# Patient Record
Sex: Female | Born: 1960 | Race: Black or African American | Hispanic: No | Marital: Single | Smoking: Never smoker
Health system: Southern US, Community
[De-identification: ages and names within clinical notes are randomized; demographics above are authoritative.]

## PROBLEM LIST (undated history)

## (undated) DIAGNOSIS — M1990 Unspecified osteoarthritis, unspecified site: Secondary | ICD-10-CM

## (undated) DIAGNOSIS — I10 Essential (primary) hypertension: Secondary | ICD-10-CM

## (undated) HISTORY — PX: APPENDECTOMY: SHX54

## (undated) HISTORY — PX: TUBAL LIGATION: SHX77

## (undated) HISTORY — DX: Essential (primary) hypertension: I10

## (undated) HISTORY — PX: FINGER FRACTURE SURGERY: SHX638

## (undated) HISTORY — PX: OTHER SURGICAL HISTORY: SHX169

## (undated) HISTORY — PX: BREAST SURGERY: SHX581

## (undated) HISTORY — PX: VEIN SURGERY: SHX48

## (undated) HISTORY — PX: INCONTINENCE SURGERY: SHX676

## (undated) HISTORY — PX: MENISCUS REPAIR: SHX5179

## (undated) HISTORY — DX: Unspecified osteoarthritis, unspecified site: M19.90

---

## 2021-04-30 ENCOUNTER — Ambulatory Visit (INDEPENDENT_AMBULATORY_CARE_PROVIDER_SITE_OTHER): Payer: 59 | Admitting: Family Medicine

## 2021-04-30 ENCOUNTER — Encounter: Payer: Self-pay | Admitting: Family Medicine

## 2021-04-30 ENCOUNTER — Other Ambulatory Visit: Payer: Self-pay

## 2021-04-30 VITALS — BP 136/78 | HR 71 | Temp 97.2°F | Ht 62.0 in | Wt 174.0 lb

## 2021-04-30 DIAGNOSIS — D649 Anemia, unspecified: Secondary | ICD-10-CM | POA: Diagnosis not present

## 2021-04-30 DIAGNOSIS — E876 Hypokalemia: Secondary | ICD-10-CM | POA: Insufficient documentation

## 2021-04-30 DIAGNOSIS — F4320 Adjustment disorder, unspecified: Secondary | ICD-10-CM | POA: Insufficient documentation

## 2021-04-30 DIAGNOSIS — E559 Vitamin D deficiency, unspecified: Secondary | ICD-10-CM

## 2021-04-30 DIAGNOSIS — E7800 Pure hypercholesterolemia, unspecified: Secondary | ICD-10-CM | POA: Diagnosis not present

## 2021-04-30 DIAGNOSIS — I10 Essential (primary) hypertension: Secondary | ICD-10-CM | POA: Diagnosis not present

## 2021-04-30 DIAGNOSIS — Z0000 Encounter for general adult medical examination without abnormal findings: Secondary | ICD-10-CM

## 2021-04-30 DIAGNOSIS — E538 Deficiency of other specified B group vitamins: Secondary | ICD-10-CM | POA: Insufficient documentation

## 2021-04-30 LAB — URINALYSIS, ROUTINE W REFLEX MICROSCOPIC
Bilirubin Urine: NEGATIVE
Hgb urine dipstick: NEGATIVE
Ketones, ur: NEGATIVE
Leukocytes,Ua: NEGATIVE
Nitrite: NEGATIVE
RBC / HPF: NONE SEEN (ref 0–?)
Specific Gravity, Urine: <=1.005 — AB (ref 1.000–1.030)
Total Protein, Urine: NEGATIVE
Urine Glucose: NEGATIVE
Urobilinogen, UA: 0.2 (ref 0.0–1.0)
WBC, UA: NONE SEEN (ref 0–?)
pH: 7.5 (ref 5.0–8.0)

## 2021-04-30 LAB — COMPREHENSIVE METABOLIC PANEL
ALT: 18 U/L (ref 0–35)
AST: 20 U/L (ref 0–37)
Albumin: 4.7 g/dL (ref 3.5–5.2)
Alkaline Phosphatase: 63 U/L (ref 39–117)
BUN: 9 mg/dL (ref 6–23)
CO2: 33 mEq/L — ABNORMAL HIGH (ref 19–32)
Calcium: 10 mg/dL (ref 8.4–10.5)
Chloride: 100 mEq/L (ref 96–112)
Creatinine, Ser: 0.77 mg/dL (ref 0.40–1.20)
GFR: 84.33 mL/min (ref >60.00)
Glucose, Bld: 103 mg/dL — ABNORMAL HIGH (ref 70–99)
Potassium: 3 mEq/L — ABNORMAL LOW (ref 3.5–5.1)
Sodium: 142 mEq/L (ref 135–145)
Total Bilirubin: 0.7 mg/dL (ref 0.2–1.2)
Total Protein: 7.5 g/dL (ref 6.0–8.3)

## 2021-04-30 LAB — LIPID PANEL
Cholesterol: 190 mg/dL (ref 0–200)
HDL: 61.3 mg/dL (ref >39.00)
LDL Cholesterol: 109 mg/dL — ABNORMAL HIGH (ref 0–99)
NonHDL: 128.75
Total CHOL/HDL Ratio: 3
Triglycerides: 101 mg/dL (ref 0.0–149.0)
VLDL: 20.2 mg/dL (ref 0.0–40.0)

## 2021-04-30 LAB — CBC
HCT: 42.3 % (ref 36.0–46.0)
Hemoglobin: 14.4 g/dL (ref 12.0–15.0)
MCHC: 34.1 g/dL (ref 30.0–36.0)
MCV: 80.5 fl (ref 78.0–100.0)
Platelets: 244 10*3/uL (ref 150.0–400.0)
RBC: 5.25 Mil/uL — ABNORMAL HIGH (ref 3.87–5.11)
RDW: 13.7 % (ref 11.5–15.5)
WBC: 6.6 10*3/uL (ref 4.0–10.5)

## 2021-04-30 LAB — B12 AND FOLATE PANEL
Folate: 19.9 ng/mL (ref >5.9)
Vitamin B-12: 184 pg/mL — ABNORMAL LOW (ref 211–911)

## 2021-04-30 LAB — VITAMIN D 25 HYDROXY (VIT D DEFICIENCY, FRACTURES): VITD: 57.42 ng/mL (ref 30.00–100.00)

## 2021-04-30 LAB — TSH: TSH: 2.03 u[IU]/mL (ref 0.35–4.50)

## 2021-04-30 MED ORDER — POTASSIUM CHLORIDE CRYS ER 20 MEQ PO TBCR: 20.0000 meq | EXTENDED_RELEASE_TABLET | Freq: Every day | ORAL | 3 refills | Status: DC

## 2021-04-30 MED ORDER — VITAMIN B-12 1000 MCG PO TABS: ORAL_TABLET | ORAL | 2 refills | Status: DC

## 2021-04-30 NOTE — Progress Notes (Signed)
Lab work revealed 2 significant issues.  1. Potassium is low as reported. Have sent a rx for potassium to pharmacy. It is important for her to take this as directed.  2. Vit B12 is low. Have sent rx to pharmacy. This could be the cause of the numbness and tingling that she has been feeling about her body.   Follow up in 3 months instead of 6.

## 2021-04-30 NOTE — Addendum Note (Signed)
Addended by: Andrez Grime on: 04/30/2021 05:10 PM   Modules accepted: Orders

## 2021-04-30 NOTE — Progress Notes (Addendum)
New Patient Office Visit  Subjective:  Patient ID: Amanda Raymond, female    DOB: January 16, 1961  Age: 60 y.o. MRN: [id number]  CC:  Chief Complaint  Patient presents with   Establish Care    NP/establish care states that she has sharp pains in right leg that come and go. Refill on medications. Patient fasting.     HPI [name] presents for establishment of care.  Recently moved into this area back in April to be closer to her daughter who lives in her last year at a local college.  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  She has another daughter up in the [place] area who is also in college.  She still grieves the loss of her murdered son some 12 years ago.  He was found strangled in his apartment.  History of hypertension and elevated cholesterol treated with amlodipine, chlorthalidone and atorvastatin respectively.  Having no issues taking these medications.  She admits some problems keeping her potassium in the normal range.  She has pains along the veins of the right leg.  She experiences paresthesias in in her upper and lower extremities.  For now these are intermittent and will pass spontaneously.  She complains of dryness in OD.  She does not smoke, drink alcohol or use illicit drugs.  Past Medical History:  Diagnosis Date   Arthritis    Hypertension     Past Surgical History:  Procedure Laterality Date   appendicitis     FINGER FRACTURE SURGERY Left    MENISCUS REPAIR Left    TUBAL LIGATION Bilateral    VEIN SURGERY      Family History  Problem Relation Age of Onset   Hypertension Mother     Social History   Socioeconomic History   Marital status: Single    Spouse name: Not on file   Number of children: Not on file   Years of education: Not on file   Highest education level: Not on file  Occupational History   Not on file  Tobacco Use   Smoking status: Never   Smokeless tobacco: Never  Vaping Use   Vaping Use: Never used  Substance  and Sexual Activity   Alcohol use: Never   Drug use: Never   Sexual activity: Not Currently  Other Topics Concern   Not on file  Social History Narrative   Not on file   Social Determinants of Health   Financial Resource Strain: Not on file  Food Insecurity: Not on file  Transportation Needs: Not on file  Physical Activity: Not on file  Stress: Not on file  Social Connections: Not on file  Intimate Partner Violence: Not on file    ROS Review of Systems  Constitutional: Negative.   HENT: Negative.    Eyes:  Negative for photophobia and visual disturbance.  Respiratory: Negative.    Cardiovascular: Negative.   Gastrointestinal: Negative.   Endocrine: Negative for polyphagia and polyuria.  Genitourinary: Negative.   Musculoskeletal:  Negative for gait problem and joint swelling.  Skin: Negative.   Neurological:  Positive for numbness. Negative for weakness.  Hematological:  Does not bruise/bleed easily.  Psychiatric/Behavioral:  Positive for dysphoric mood.    Objective:   Today's Vitals: BP 136/78   Pulse 71   Temp (!) 97.2 F (36.2 C) (Temporal)   Ht 5\' 2"  (1.575 m)   Wt 174 lb (78.9 kg)   SpO2 99%   BMI 31.83 kg/m   Physical  Exam Vitals and nursing note reviewed.  Constitutional:      General: She is not in acute distress.    Appearance: Normal appearance. She is not ill-appearing, toxic-appearing or diaphoretic.  HENT:     Head: Normocephalic and atraumatic.     Right Ear: Tympanic membrane, ear canal and external ear normal.     Left Ear: Tympanic membrane, ear canal and external ear normal.     Mouth/Throat:     Mouth: Mucous membranes are moist.     Pharynx: Oropharynx is clear. No oropharyngeal exudate or posterior oropharyngeal erythema.  Eyes:     General: No scleral icterus.       Right eye: No discharge.        Left eye: No discharge.     Extraocular Movements: Extraocular movements intact.     Conjunctiva/sclera: Conjunctivae normal.      Pupils: Pupils are equal, round, and reactive to light.  Neck:     Vascular: No carotid bruit.  Cardiovascular:     Rate and Rhythm: Normal rate and regular rhythm.  Pulmonary:     Effort: Pulmonary effort is normal.     Breath sounds: Normal breath sounds.  Abdominal:     General: Bowel sounds are normal.  Musculoskeletal:     Cervical back: No rigidity or tenderness.     Right lower leg: No edema.     Left lower leg: No edema.  Lymphadenopathy:     Cervical: No cervical adenopathy.  Skin:    General: Skin is warm and dry.  Neurological:     Mental Status: She is alert and oriented to person, place, and time.  Psychiatric:        Mood and Affect: Mood normal.        Behavior: Behavior normal.    Assessment & Plan:   Problem List Items Addressed This Visit       Cardiovascular and Mediastinum   Essential hypertension - Primary   Relevant Medications   amLODipine (NORVASC) 5 MG tablet   atorvastatin (LIPITOR) 10 MG tablet   chlorthalidone (HYGROTON) 25 MG tablet   Other Relevant Orders   CBC (Completed)   Comprehensive metabolic panel (Completed)   Urinalysis, Routine w reflex microscopic (Completed)     Other   Elevated cholesterol   Relevant Medications   amLODipine (NORVASC) 5 MG tablet   atorvastatin (LIPITOR) 10 MG tablet   chlorthalidone (HYGROTON) 25 MG tablet   Other Relevant Orders   Comprehensive metabolic panel (Completed)   Lipid panel (Completed)   Adjustment disorder   Vitamin D deficiency   Relevant Orders   VITAMIN D 25 Hydroxy (Vit-D Deficiency, Fractures) (Completed)   Healthcare maintenance   Relevant Orders   MM Digital Screening   Ambulatory referral to Gynecology   Ambulatory referral to Ophthalmology   Anemia   Relevant Medications   vitamin B-12 (CYANOCOBALAMIN) 1000 MCG tablet   Other Relevant Orders   B12 and Folate Panel (Completed)   TSH (Completed)   Iron, TIBC and Ferritin Panel   Hypokalemia   Relevant Medications    potassium chloride SA (KLOR-CON) 20 MEQ tablet   Vitamin B 12 deficiency   Relevant Medications   vitamin B-12 (CYANOCOBALAMIN) 1000 MCG tablet    Outpatient Encounter Medications as of 04/30/2021  Medication Sig   amLODipine (NORVASC) 5 MG tablet Take 1 tablet by mouth daily.   atorvastatin (LIPITOR) 10 MG tablet Take 1 tablet by mouth daily.   chlorthalidone (HYGROTON) 25  MG tablet Take 25 mg by mouth daily.   potassium chloride SA (KLOR-CON) 20 MEQ tablet Take 1 tablet (20 mEq total) by mouth daily.   vitamin B-12 (CYANOCOBALAMIN) 1000 MCG tablet Take one daily.   No facility-administered encounter medications on file as of 04/30/2021.    Follow-up: Return in about 3 months (around 07/31/2021), or if symptoms worsen or fail to improve.   Patient was given information on managing loss.  We discussed her PHQ-9 results and she would like to hold treatment for you.  He was given information on preventing high cholesterol and managing hypertension.  Also given information on preventive care and health maintenance.  Invited her to increase her exercise by joining the copious walking trails in the area along with her poodle.  She will keep me apprised of the paresthesias she has been experiencing in her extremities.  Discussed that she may need supplemental potassium.  Mliss Sax, MD

## 2021-05-01 LAB — IRON,TIBC AND FERRITIN PANEL
%SAT: 34 % (calc) (ref 16–45)
Ferritin: 102 ng/mL (ref 16–232)
Iron: 105 ug/dL (ref 45–160)
TIBC: 313 mcg/dL (calc) (ref 250–450)

## 2021-06-01 ENCOUNTER — Other Ambulatory Visit: Payer: Self-pay | Admitting: Family Medicine

## 2021-06-01 NOTE — Telephone Encounter (Signed)
Refill request for Atorvastatin and Chlorthliadone.  Both filled by HX provider.  LOV 04/30/21  Please review and advise.  Thanks.  Dm/cma

## 2021-06-06 ENCOUNTER — Other Ambulatory Visit: Payer: Self-pay | Admitting: Family Medicine

## 2021-06-11 ENCOUNTER — Ambulatory Visit (HOSPITAL_BASED_OUTPATIENT_CLINIC_OR_DEPARTMENT_OTHER)
Admission: RE | Admit: 2021-06-11 | Discharge: 2021-06-11 | Disposition: A | Discharge status: Home / Self Care | Payer: 59 | Source: Ambulatory Visit | Attending: Family Medicine | Admitting: Family Medicine

## 2021-06-11 ENCOUNTER — Encounter (HOSPITAL_BASED_OUTPATIENT_CLINIC_OR_DEPARTMENT_OTHER): Payer: Self-pay

## 2021-06-11 ENCOUNTER — Other Ambulatory Visit: Payer: Self-pay

## 2021-06-11 DIAGNOSIS — Z1231 Encounter for screening mammogram for malignant neoplasm of breast: Secondary | ICD-10-CM | POA: Insufficient documentation

## 2021-06-11 DIAGNOSIS — Z0000 Encounter for general adult medical examination without abnormal findings: Secondary | ICD-10-CM | POA: Insufficient documentation

## 2021-07-13 ENCOUNTER — Other Ambulatory Visit: Payer: Self-pay | Admitting: Family Medicine

## 2021-07-17 ENCOUNTER — Other Ambulatory Visit: Payer: Self-pay

## 2021-07-17 ENCOUNTER — Ambulatory Visit (INDEPENDENT_AMBULATORY_CARE_PROVIDER_SITE_OTHER): Payer: 59 | Admitting: Nurse Practitioner

## 2021-07-17 ENCOUNTER — Encounter: Payer: Self-pay | Admitting: Nurse Practitioner

## 2021-07-17 ENCOUNTER — Other Ambulatory Visit (HOSPITAL_COMMUNITY)
Admission: RE | Admit: 2021-07-17 | Discharge: 2021-07-17 | Disposition: A | Discharge status: Home / Self Care | Payer: 59 | Source: Ambulatory Visit | Attending: Nurse Practitioner | Admitting: Nurse Practitioner

## 2021-07-17 VITALS — BP 130/84 | Ht 61.5 in | Wt 176.0 lb

## 2021-07-17 DIAGNOSIS — Z01419 Encounter for gynecological examination (general) (routine) without abnormal findings: Secondary | ICD-10-CM | POA: Diagnosis not present

## 2021-07-17 DIAGNOSIS — Z780 Asymptomatic menopausal state: Secondary | ICD-10-CM | POA: Diagnosis not present

## 2021-07-17 NOTE — Progress Notes (Signed)
   Amanda Raymond [birth year] [id number]   History:  60 y.o. G3P3 presents to establish care. No GYN complaints. Postmenopausal - no HRT, no bleeding. Normal pap and mammogram history. Moved here in April from [place]. HTN, HLD managed by PCP.  Gynecologic History No LMP recorded. Patient has had an ablation.   Contraception/Family planning: post menopausal status Sexually active: No  Health Maintenance Last Pap: 2019. Results were: Normal Last mammogram: 06/11/2021. Results were: Normal Last colonoscopy: 2018. Results were: Normal, 10-year recall Last Dexa: 04/17/2020. Results were: Normal  Past medical history, past surgical history, family history and social history were all reviewed and documented in the EPIC chart. Single. 2 daughters, one at [employer], one at school in [place]. Toy poodle named Mocha.   ROS:  A ROS was performed and pertinent positives and negatives are included.  Exam:  Vitals:   07/17/21 1448  BP: 130/84  Weight: 176 lb (79.8 kg)  Height: 5' 1.5" (1.562 m)   Body mass index is 32.72 kg/m.  General appearance:  Normal Thyroid:  Symmetrical, normal in size, without palpable masses or nodularity. Respiratory  Auscultation:  Clear without wheezing or rhonchi Cardiovascular  Auscultation:  Regular rate, without rubs, murmurs or gallops  Edema/varicosities:  Not grossly evident Abdominal  Soft,nontender, without masses, guarding or rebound.  Liver/spleen:  No organomegaly noted  Hernia:  None appreciated  Skin  Inspection:  Grossly normal Breasts: Examined lying and sitting.   Right: Without masses, retractions, nipple discharge or axillary adenopathy.   Left: Without masses, retractions, nipple discharge or axillary adenopathy. Genitourinary   Inguinal/mons:  Normal without inguinal adenopathy  External genitalia:  Normal appearing vulva with no masses, tenderness, or lesions  BUS/Urethra/Skene's glands:  Normal  Vagina:  Atrophic changes  Cervix:   Normal appearing without discharge or lesions  Uterus:  Normal in size, shape and contour.  Midline and mobile, nontender  Adnexa/parametria:     Rt: Normal in size, without masses or tenderness.   Lt: Normal in size, without masses or tenderness.  Anus and perineum: Normal  Digital rectal exam: Normal sphincter tone without palpated masses or tenderness  Patient informed chaperone available to be present for breast and pelvic exam. Patient has requested no chaperone to be present. Patient has been advised what will be completed during breast and pelvic exam.   Assessment/Plan:  60 y.o. G3P3 for annual exam.   Well female exam with routine gynecological exam - Plan: Cytology - PAP( ) - Education provided on SBEs, importance of preventative screenings, current guidelines, high calcium diet, regular exercise, and multivitamin daily. Recently established with PCP, labs done at that time.   Postmenopausal - no HRT, no bleeding.   Screening for cervical cancer - Normal Pap history. She believes most recent pap was in 2019. Pap with HR HPV today. She has requested records be sent to our office.   Screening for breast cancer - Normal mammogram history.  Continue annual screenings.  Normal breast exam today.  Screening for colon cancer - 2018 colonoscopy (per patient). Will repeat at GI's recommended interval.   Screening for osteoporosis - Normal Dexa 06/2021. Will repeat at 5-year interval per recommendation.   Return in 1 year for annual.   Olivia Mackie DNP, 3:24 PM 07/17/2021

## 2021-07-21 ENCOUNTER — Other Ambulatory Visit: Payer: Self-pay | Admitting: Family Medicine

## 2021-07-21 DIAGNOSIS — E876 Hypokalemia: Secondary | ICD-10-CM

## 2021-07-23 LAB — CYTOLOGY - PAP
Comment: NEGATIVE
Diagnosis: NEGATIVE
High risk HPV: NEGATIVE

## 2021-07-31 ENCOUNTER — Encounter: Payer: Self-pay | Admitting: Family Medicine

## 2021-07-31 ENCOUNTER — Other Ambulatory Visit: Payer: Self-pay

## 2021-07-31 ENCOUNTER — Ambulatory Visit (INDEPENDENT_AMBULATORY_CARE_PROVIDER_SITE_OTHER): Payer: 59 | Admitting: Family Medicine

## 2021-07-31 VITALS — BP 130/78 | HR 88 | Temp 97.4°F | Ht 61.0 in | Wt 176.0 lb

## 2021-07-31 DIAGNOSIS — E7800 Pure hypercholesterolemia, unspecified: Secondary | ICD-10-CM | POA: Diagnosis not present

## 2021-07-31 DIAGNOSIS — Z0000 Encounter for general adult medical examination without abnormal findings: Secondary | ICD-10-CM

## 2021-07-31 DIAGNOSIS — E538 Deficiency of other specified B group vitamins: Secondary | ICD-10-CM | POA: Diagnosis not present

## 2021-07-31 DIAGNOSIS — I10 Essential (primary) hypertension: Secondary | ICD-10-CM | POA: Diagnosis not present

## 2021-07-31 DIAGNOSIS — E876 Hypokalemia: Secondary | ICD-10-CM

## 2021-07-31 LAB — BASIC METABOLIC PANEL
BUN: 13 mg/dL (ref 6–23)
CO2: 31 mEq/L (ref 19–32)
Calcium: 9.7 mg/dL (ref 8.4–10.5)
Chloride: 103 mEq/L (ref 96–112)
Creatinine, Ser: 0.81 mg/dL (ref 0.40–1.20)
GFR: 79.22 mL/min (ref >60.00)
Glucose, Bld: 96 mg/dL (ref 70–99)
Potassium: 3.4 mEq/L — ABNORMAL LOW (ref 3.5–5.1)
Sodium: 142 mEq/L (ref 135–145)

## 2021-07-31 LAB — VITAMIN B12: Vitamin B-12: 552 pg/mL (ref 211–911)

## 2021-07-31 NOTE — Progress Notes (Addendum)
Established Patient Office Visit  Subjective:  Patient ID: Amanda Raymond, female    DOB: October 25, 1961  Age: 60 y.o. MRN: [id number]  CC:  Chief Complaint  Patient presents with   Follow-up    Follow up on BP and labs. Patient would like to come down on BP meds and cholesterol meds. Fasting for labs.     HPI Amanda Raymond presents for follow-up of B12 deficiency and hypokalemia.  She has been taking her vitamin B-12 daily without issue.  Continues the potassium.  Wonders if she can discontinue the statin.  Explained that her cholesterol was high and back upwards if she did not discontinue.  She has never had a flu shot and does not really want to have one.  She is has had 2 COVID vaccines.  Past Medical History:  Diagnosis Date   Arthritis    Hypertension     Past Surgical History:  Procedure Laterality Date   APPENDECTOMY     appendicitis     BREAST SURGERY Right    Lumpectomy-benign   FINGER FRACTURE SURGERY Left    INCONTINENCE SURGERY N/A    MENISCUS REPAIR Left    TUBAL LIGATION Bilateral    VEIN SURGERY      Family History  Problem Relation Age of Onset   Hypertension Mother    Hypertension Brother    Breast cancer Maternal Grandmother     Social History   Socioeconomic History   Marital status: Single    Spouse name: Not on file   Number of children: Not on file   Years of education: Not on file   Highest education level: Not on file  Occupational History   Not on file  Tobacco Use   Smoking status: Never   Smokeless tobacco: Never  Vaping Use   Vaping Use: Never used  Substance and Sexual Activity   Alcohol use: Never   Drug use: Never   Sexual activity: Yes  Other Topics Concern   Not on file  Social History Narrative   Not on file   Social Determinants of Health   Financial Resource Strain: Not on file  Food Insecurity: Not on file  Transportation Needs: Not on file  Physical Activity: Not on file  Stress: Not on file  Social  Connections: Not on file  Intimate Partner Violence: Not on file    Outpatient Medications Prior to Visit  Medication Sig Dispense Refill   amLODipine (NORVASC) 5 MG tablet TAKE 1 TABLET BY MOUTH EVERY DAY. DISCONTINUE IRBESARTAN 300MG  30 tablet 1   atorvastatin (LIPITOR) 10 MG tablet TAKE 1 TABLET BY MOUTH EVERY DAY 90 tablet 0   chlorthalidone (HYGROTON) 25 MG tablet TAKE 1 TABLET (25 MG TOTAL) BY MOUTH DAILY. 90 tablet 0   KLOR-CON M20 20 MEQ tablet TAKE 1 TABLET BY MOUTH EVERY DAY 90 tablet 1   vitamin B-12 (CYANOCOBALAMIN) 1000 MCG tablet Take one daily. 90 tablet 2   No facility-administered medications prior to visit.    Allergies  Allergen Reactions   Bactrim [Sulfamethoxazole-Trimethoprim] Nausea And Vomiting   Toradol [Ketorolac Tromethamine] Swelling    ROS Review of Systems  Constitutional: Negative.   Respiratory: Negative.    Cardiovascular: Negative.   Gastrointestinal: Negative.   Genitourinary: Negative.   Psychiatric/Behavioral: Negative.       Objective:    Physical Exam Vitals and nursing note reviewed.  Constitutional:      General: She is not in acute distress.    Appearance: Normal  appearance. She is not ill-appearing, toxic-appearing or diaphoretic.  HENT:     Head: Normocephalic and atraumatic.     Right Ear: External ear normal.     Left Ear: External ear normal.  Eyes:     Extraocular Movements: Extraocular movements intact.     Conjunctiva/sclera: Conjunctivae normal.  Cardiovascular:     Rate and Rhythm: Normal rate and regular rhythm.  Pulmonary:     Effort: Pulmonary effort is normal.     Breath sounds: Normal breath sounds.  Abdominal:     General: Bowel sounds are normal.  Musculoskeletal:     Cervical back: No rigidity or tenderness.     Right lower leg: No edema.     Left lower leg: No edema.  Lymphadenopathy:     Cervical: No cervical adenopathy.  Skin:    General: Skin is warm and dry.  Neurological:     Mental Status:  She is alert and oriented to person, place, and time.  Psychiatric:        Mood and Affect: Mood normal.        Behavior: Behavior normal.    BP 130/78 (BP Location: Right Arm, Patient Position: Sitting, Cuff Size: Normal)   Pulse 88   Temp (!) 97.4 F (36.3 C) (Temporal)   Ht 5\' 1"  (1.549 m)   Wt 176 lb (79.8 kg)   SpO2 98%   BMI 33.25 kg/m  Wt Readings from Last 3 Encounters:  07/31/21 176 lb (79.8 kg)  07/17/21 176 lb (79.8 kg)  04/30/21 174 lb (78.9 kg)     Health Maintenance Due  Topic Date Due   HIV Screening  Never done   Hepatitis C Screening  Never done   Zoster Vaccines- Shingrix (1 of 2) Never done   INFLUENZA VACCINE  Never done    There are no preventive care reminders to display for this patient.  Lab Results  Component Value Date   TSH 2.03 04/30/2021   Lab Results  Component Value Date   WBC 6.6 04/30/2021   HGB 14.4 04/30/2021   HCT 42.3 04/30/2021   MCV 80.5 04/30/2021   PLT 244.0 04/30/2021   Lab Results  Component Value Date   NA 142 07/31/2021   K 3.4 (L) 07/31/2021   CO2 31 07/31/2021   GLUCOSE 96 07/31/2021   BUN 13 07/31/2021   CREATININE 0.81 07/31/2021   BILITOT 0.7 04/30/2021   ALKPHOS 63 04/30/2021   AST 20 04/30/2021   ALT 18 04/30/2021   PROT 7.5 04/30/2021   ALBUMIN 4.7 04/30/2021   CALCIUM 9.7 07/31/2021   GFR 79.22 07/31/2021   Lab Results  Component Value Date   CHOL 190 04/30/2021   Lab Results  Component Value Date   HDL 61.30 04/30/2021   Lab Results  Component Value Date   LDLCALC 109 (H) 04/30/2021   Lab Results  Component Value Date   TRIG 101.0 04/30/2021   Lab Results  Component Value Date   CHOLHDL 3 04/30/2021   No results found for: HGBA1C   The 10-year ASCVD risk score (Arnett DK, et al., 2019) is: 6%   Values used to calculate the score:     Age: 19 years     Sex: Female     Is Non-Hispanic African American: Yes     Diabetic: No     Tobacco smoker: No     Systolic Blood Pressure:  130 mmHg     Is BP treated: Yes  HDL Cholesterol: 61.3 mg/dL     Total Cholesterol: 190 mg/dL  Assessment & Plan:   Problem List Items Addressed This Visit       Cardiovascular and Mediastinum   Essential hypertension - Primary   Relevant Orders   Basic metabolic panel (Completed)     Other   Elevated cholesterol   Healthcare maintenance   Hypokalemia   Relevant Orders   Basic metabolic panel (Completed)   Vitamin B 12 deficiency   Relevant Orders   Vitamin B12 (Completed)    No orders of the defined types were placed in this encounter.   Follow-up: Return in about 6 months (around 01/28/2022), or Continue all medications including potassium.  Please take all daily..  Encourage patient to have influenza vaccine and the COVID booster.  Information was given on immunizations and cranial disease with immunizations.  We discussed discontinuing the chlorthalidone and potassium in favor of raising amlodipine.  Patient would like to continue the chlorthalidone because it helps with the swelling in her lower extremities even if it means having to take the potassium pill daily.  She will continue her high-dose B12 and statin medication.  Mliss Sax, MD

## 2021-08-05 ENCOUNTER — Other Ambulatory Visit: Payer: Self-pay | Admitting: Family Medicine

## 2021-09-04 ENCOUNTER — Other Ambulatory Visit: Payer: Self-pay | Admitting: Family Medicine

## 2021-10-30 ENCOUNTER — Ambulatory Visit: Payer: 59 | Admitting: Family Medicine

## 2021-12-02 ENCOUNTER — Other Ambulatory Visit: Payer: Self-pay | Admitting: Family Medicine

## 2022-01-14 ENCOUNTER — Other Ambulatory Visit: Payer: Self-pay | Admitting: Family Medicine

## 2022-01-14 DIAGNOSIS — E876 Hypokalemia: Secondary | ICD-10-CM

## 2022-01-23 ENCOUNTER — Other Ambulatory Visit: Payer: Self-pay | Admitting: Family Medicine

## 2022-01-23 DIAGNOSIS — E538 Deficiency of other specified B group vitamins: Secondary | ICD-10-CM

## 2022-01-29 ENCOUNTER — Encounter: Payer: Self-pay | Admitting: Family Medicine

## 2022-01-29 ENCOUNTER — Ambulatory Visit (INDEPENDENT_AMBULATORY_CARE_PROVIDER_SITE_OTHER): Payer: 59 | Admitting: Family Medicine

## 2022-01-29 ENCOUNTER — Other Ambulatory Visit: Payer: Self-pay

## 2022-01-29 VITALS — BP 150/90 | HR 82 | Temp 97.1°F | Ht 61.0 in | Wt 162.4 lb

## 2022-01-29 DIAGNOSIS — E876 Hypokalemia: Secondary | ICD-10-CM

## 2022-01-29 DIAGNOSIS — E538 Deficiency of other specified B group vitamins: Secondary | ICD-10-CM | POA: Diagnosis not present

## 2022-01-29 DIAGNOSIS — Z23 Encounter for immunization: Secondary | ICD-10-CM | POA: Diagnosis not present

## 2022-01-29 DIAGNOSIS — E7800 Pure hypercholesterolemia, unspecified: Secondary | ICD-10-CM

## 2022-01-29 DIAGNOSIS — I10 Essential (primary) hypertension: Secondary | ICD-10-CM | POA: Diagnosis not present

## 2022-01-29 LAB — COMPREHENSIVE METABOLIC PANEL
ALT: 15 U/L (ref 0–35)
AST: 18 U/L (ref 0–37)
Albumin: 4.5 g/dL (ref 3.5–5.2)
Alkaline Phosphatase: 64 U/L (ref 39–117)
BUN: 11 mg/dL (ref 6–23)
CO2: 31 mEq/L (ref 19–32)
Calcium: 9.9 mg/dL (ref 8.4–10.5)
Chloride: 101 mEq/L (ref 96–112)
Creatinine, Ser: 0.83 mg/dL (ref 0.40–1.20)
GFR: 76.67 mL/min (ref >60.00)
Glucose, Bld: 108 mg/dL — ABNORMAL HIGH (ref 70–99)
Potassium: 3 mEq/L — ABNORMAL LOW (ref 3.5–5.1)
Sodium: 143 mEq/L (ref 135–145)
Total Bilirubin: 0.6 mg/dL (ref 0.2–1.2)
Total Protein: 7.1 g/dL (ref 6.0–8.3)

## 2022-01-29 LAB — LIPID PANEL
Cholesterol: 160 mg/dL (ref 0–200)
HDL: 54.2 mg/dL (ref >39.00)
LDL Cholesterol: 91 mg/dL (ref 0–99)
NonHDL: 105.91
Total CHOL/HDL Ratio: 3
Triglycerides: 74 mg/dL (ref 0.0–149.0)
VLDL: 14.8 mg/dL (ref 0.0–40.0)

## 2022-01-29 LAB — LDL CHOLESTEROL, DIRECT: Direct LDL: 86 mg/dL

## 2022-01-29 LAB — VITAMIN B12: Vitamin B-12: 1071 pg/mL — ABNORMAL HIGH (ref 211–911)

## 2022-01-29 MED ORDER — AMLODIPINE BESYLATE 10 MG PO TABS: 10.0000 mg | ORAL_TABLET | Freq: Every day | ORAL | 1 refills | Status: DC

## 2022-01-29 NOTE — Progress Notes (Signed)
? ?Established Patient Office Visit ? ?Subjective:  ?Patient ID: Amanda Raymond, female    DOB: 1961-06-28  Age: 61 y.o. MRN: [id number] ? ?CC:  ?Chief Complaint  ?Patient presents with  ? Follow-up  ?  Follow up Pt want to discuss Blood Presser Meds.  ? ? ?HPI ?Amanda Raymond presents for follow-up of hypertension and elevated cholesterol.  She is fasting today.  Potassium pill is sometimes difficult to swallow.  She is otherwise having no difficulty with her medications ? ?Past Medical History:  ?Diagnosis Date  ? Arthritis   ? Hypertension   ? ? ?Past Surgical History:  ?Procedure Laterality Date  ? APPENDECTOMY    ? appendicitis    ? BREAST SURGERY Right   ? Lumpectomy-benign  ? FINGER FRACTURE SURGERY Left   ? INCONTINENCE SURGERY N/A   ? MENISCUS REPAIR Left   ? TUBAL LIGATION Bilateral   ? VEIN SURGERY    ? ? ?Family History  ?Problem Relation Age of Onset  ? Hypertension Mother   ? Hypertension Brother   ? Breast cancer Maternal Grandmother   ? ? ?Social History  ? ?Socioeconomic History  ? Marital status: Single  ?  Spouse name: Not on file  ? Number of children: Not on file  ? Years of education: Not on file  ? Highest education level: Not on file  ?Occupational History  ? Not on file  ?Tobacco Use  ? Smoking status: Never  ? Smokeless tobacco: Never  ?Vaping Use  ? Vaping Use: Never used  ?Substance and Sexual Activity  ? Alcohol use: Never  ? Drug use: Never  ? Sexual activity: Yes  ?Other Topics Concern  ? Not on file  ?Social History Narrative  ? Not on file  ? ?Social Determinants of Health  ? ?Financial Resource Strain: Not on file  ?Food Insecurity: Not on file  ?Transportation Needs: Not on file  ?Physical Activity: Not on file  ?Stress: Not on file  ?Social Connections: Not on file  ?Intimate Partner Violence: Not on file  ? ? ?Outpatient Medications Prior to Visit  ?Medication Sig Dispense Refill  ? vitamin B-12 (CYANOCOBALAMIN) 1000 MCG tablet TAKE 1 TABLET BY MOUTH EVERY DAY 90 tablet 0  ?  chlorthalidone (HYGROTON) 25 MG tablet TAKE 1 TABLET (25 MG TOTAL) BY MOUTH DAILY. 90 tablet 0  ? KLOR-CON M20 20 MEQ tablet TAKE 1 TABLET BY MOUTH EVERY DAY 90 tablet 1  ? atorvastatin (LIPITOR) 10 MG tablet TAKE 1 TABLET BY MOUTH EVERY DAY (Patient not taking: Reported on 01/29/2022) 90 tablet 0  ? amLODipine (NORVASC) 5 MG tablet TAKE 1 TABLET BY MOUTH EVERY DAY. DISCONTINUE IRBESARTAN 300MG  90 tablet 1  ? ?No facility-administered medications prior to visit.  ? ? ?Allergies  ?Allergen Reactions  ? Bactrim [Sulfamethoxazole-Trimethoprim] Nausea And Vomiting  ? Toradol [Ketorolac Tromethamine] Swelling  ? ? ?ROS ?Review of Systems  ?Constitutional:  Negative for chills, diaphoresis, fatigue, fever and unexpected weight change.  ?HENT: Negative.    ?Eyes:  Negative for photophobia and visual disturbance.  ?Respiratory: Negative.    ?Cardiovascular: Negative.   ?Gastrointestinal: Negative.   ?Endocrine: Negative for polyphagia and polyuria.  ?Genitourinary: Negative.   ?Musculoskeletal:  Negative for gait problem and joint swelling.  ?Neurological:  Negative for speech difficulty, weakness and numbness.  ?Psychiatric/Behavioral: Negative.    ? ?  ?Objective:  ?  ?Physical Exam ?Vitals and nursing note reviewed.  ?Constitutional:   ?   General: She is not  in acute distress. ?   Appearance: Normal appearance. She is not ill-appearing, toxic-appearing or diaphoretic.  ?HENT:  ?   Head: Normocephalic and atraumatic.  ?   Right Ear: External ear normal.  ?   Left Ear: External ear normal.  ?   Mouth/Throat:  ?   Mouth: Mucous membranes are moist.  ?   Pharynx: Oropharynx is clear. No oropharyngeal exudate or posterior oropharyngeal erythema.  ?Eyes:  ?   General: No scleral icterus.    ?   Right eye: No discharge.     ?   Left eye: No discharge.  ?   Extraocular Movements: Extraocular movements intact.  ?   Conjunctiva/sclera: Conjunctivae normal.  ?   Pupils: Pupils are equal, round, and reactive to light.   ?Cardiovascular:  ?   Rate and Rhythm: Normal rate and regular rhythm.  ?Pulmonary:  ?   Effort: Pulmonary effort is normal.  ?   Breath sounds: Normal breath sounds.  ?Abdominal:  ?   General: Bowel sounds are normal.  ?Musculoskeletal:  ?   Cervical back: No rigidity or tenderness.  ?Lymphadenopathy:  ?   Cervical: No cervical adenopathy.  ?Skin: ?   General: Skin is warm and dry.  ?Neurological:  ?   Mental Status: She is alert and oriented to person, place, and time.  ?Psychiatric:     ?   Mood and Affect: Mood normal.     ?   Behavior: Behavior normal.  ? ? ?BP (!) 150/90 (BP Location: Left Arm, Patient Position: Sitting, Cuff Size: Normal)   Pulse 82   Temp (!) 97.1 ?F (36.2 ?C) (Temporal)   Ht 5\' 1"  (1.549 m)   Wt 162 lb 6.4 oz (73.7 kg)   SpO2 98%   BMI 30.69 kg/m?  ?Wt Readings from Last 3 Encounters:  ?01/29/22 162 lb 6.4 oz (73.7 kg)  ?07/31/21 176 lb (79.8 kg)  ?07/17/21 176 lb (79.8 kg)  ? ? ? ?Health Maintenance Due  ?Topic Date Due  ? HIV Screening  Never done  ? Hepatitis C Screening  Never done  ? INFLUENZA VACCINE  Never done  ? ? ?There are no preventive care reminders to display for this patient. ? ?Lab Results  ?Component Value Date  ? TSH 2.03 04/30/2021  ? ?Lab Results  ?Component Value Date  ? WBC 6.6 04/30/2021  ? HGB 14.4 04/30/2021  ? HCT 42.3 04/30/2021  ? MCV 80.5 04/30/2021  ? PLT 244.0 04/30/2021  ? ?Lab Results  ?Component Value Date  ? NA 142 07/31/2021  ? K 3.4 (L) 07/31/2021  ? CO2 31 07/31/2021  ? GLUCOSE 96 07/31/2021  ? BUN 13 07/31/2021  ? CREATININE 0.81 07/31/2021  ? BILITOT 0.7 04/30/2021  ? ALKPHOS 63 04/30/2021  ? AST 20 04/30/2021  ? ALT 18 04/30/2021  ? PROT 7.5 04/30/2021  ? ALBUMIN 4.7 04/30/2021  ? CALCIUM 9.7 07/31/2021  ? GFR 79.22 07/31/2021  ? ?Lab Results  ?Component Value Date  ? CHOL 190 04/30/2021  ? ?Lab Results  ?Component Value Date  ? HDL 61.30 04/30/2021  ? ?Lab Results  ?Component Value Date  ? LDLCALC 109 (H) 04/30/2021  ? ?Lab Results   ?Component Value Date  ? TRIG 101.0 04/30/2021  ? ?Lab Results  ?Component Value Date  ? CHOLHDL 3 04/30/2021  ? ?No results found for: HGBA1C ? ?  ?Assessment & Plan:  ? ?Problem List Items Addressed This Visit   ? ?  ?  Cardiovascular and Mediastinum  ? Essential hypertension - Primary  ? Relevant Medications  ? amLODipine (NORVASC) 10 MG tablet  ? Other Relevant Orders  ? Comprehensive metabolic panel  ?  ? Other  ? Elevated cholesterol  ? Relevant Medications  ? amLODipine (NORVASC) 10 MG tablet  ? Other Relevant Orders  ? Comprehensive metabolic panel  ? LDL cholesterol, direct  ? Lipid panel  ? Hypokalemia  ? Relevant Orders  ? Comprehensive metabolic panel  ? Vitamin B 12 deficiency  ? Relevant Orders  ? Vitamin B12  ? Need for shingles vaccine  ? Relevant Orders  ? Varicella-zoster vaccine IM (Shingrix) (Completed)  ? ? ?Meds ordered this encounter  ?Medications  ? amLODipine (NORVASC) 10 MG tablet  ?  Sig: Take 1 tablet (10 mg total) by mouth daily.  ?  Dispense:  90 tablet  ?  Refill:  1  ? ? ?Follow-up: Return in about 3 months (around 05/01/2022).  ?Have discontinued chlorthalidone and potassium chloride.  Have increased amlodipine to 10 mg daily.  Asked patient to look out for lower extremity edema.  B12.  Checking cord blood pressures at home.  If needed may add potassium sparing diuretic.  Information was given on preventing high cholesterol and managing hypertension.  Information given on the Shingrix vaccine.  Second dose next visit. ? ?Libby Maw, MD ?

## 2022-02-03 ENCOUNTER — Other Ambulatory Visit: Payer: Self-pay | Admitting: Family Medicine

## 2022-03-01 ENCOUNTER — Other Ambulatory Visit: Payer: Self-pay | Admitting: Family Medicine

## 2022-04-29 ENCOUNTER — Ambulatory Visit (INDEPENDENT_AMBULATORY_CARE_PROVIDER_SITE_OTHER): Payer: 59 | Admitting: Family Medicine

## 2022-04-29 ENCOUNTER — Encounter: Payer: Self-pay | Admitting: Family Medicine

## 2022-04-29 VITALS — BP 134/82 | HR 77 | Temp 97.2°F | Ht 61.0 in | Wt 160.0 lb

## 2022-04-29 DIAGNOSIS — Z23 Encounter for immunization: Secondary | ICD-10-CM | POA: Diagnosis not present

## 2022-04-29 DIAGNOSIS — E538 Deficiency of other specified B group vitamins: Secondary | ICD-10-CM

## 2022-04-29 DIAGNOSIS — E7800 Pure hypercholesterolemia, unspecified: Secondary | ICD-10-CM | POA: Diagnosis not present

## 2022-04-29 DIAGNOSIS — I10 Essential (primary) hypertension: Secondary | ICD-10-CM

## 2022-04-29 LAB — BASIC METABOLIC PANEL
BUN: 11 mg/dL (ref 6–23)
CO2: 26 mEq/L (ref 19–32)
Calcium: 9.4 mg/dL (ref 8.4–10.5)
Chloride: 106 mEq/L (ref 96–112)
Creatinine, Ser: 0.78 mg/dL (ref 0.40–1.20)
GFR: 82.46 mL/min (ref >60.00)
Glucose, Bld: 95 mg/dL (ref 70–99)
Potassium: 3.4 mEq/L — ABNORMAL LOW (ref 3.5–5.1)
Sodium: 142 mEq/L (ref 135–145)

## 2022-04-29 LAB — LDL CHOLESTEROL, DIRECT: Direct LDL: 103 mg/dL

## 2022-04-29 LAB — VITAMIN B12: Vitamin B-12: 847 pg/mL (ref 211–911)

## 2022-04-29 NOTE — Progress Notes (Signed)
Established Patient Office Visit  Subjective   Patient ID: Amanda Raymond, female    DOB: [birth year]  Age: 61 y.o. MRN: [id number]  Chief Complaint  Patient presents with   Follow-up    6 mo f/u. Would like to discuss second shingles vax. Pt is fasting.    HPI follow-up of hypertension, hypokalemia, B12 deficiency.  Has done well status post discontinuation of chlorthalidone and potassium.  Blood pressures remained in the normal range in the 130s over 80s.  Developed arm soreness for almost 2 weeks after her last Shingrix vaccine.  Has been able to lose weight over the last several months.  Questions need for atorvastatin.  Her ASCVD risk score is 6.4%.    Review of Systems  Constitutional:  Negative for chills, diaphoresis, malaise/fatigue and weight loss.  HENT: Negative.    Eyes: Negative.  Negative for blurred vision and double vision.  Cardiovascular:  Negative for chest pain.  Gastrointestinal:  Negative for abdominal pain.  Genitourinary: Negative.   Musculoskeletal:  Negative for falls and myalgias.  Neurological:  Negative for speech change, loss of consciousness and weakness.  Psychiatric/Behavioral: Negative.        Objective:     BP 134/82 (BP Location: Right Arm, Patient Position: Sitting, Cuff Size: Normal)   Pulse 77   Temp (!) 97.2 F (36.2 C)   Ht 5\' 1"  (1.549 m)   Wt 160 lb (72.6 kg)   SpO2 100%   BMI 30.23 kg/m  BP Readings from Last 3 Encounters:  04/29/22 134/82  01/29/22 (!) 150/90  07/31/21 130/78   Wt Readings from Last 3 Encounters:  04/29/22 160 lb (72.6 kg)  01/29/22 162 lb 6.4 oz (73.7 kg)  07/31/21 176 lb (79.8 kg)      Physical Exam Constitutional:      General: She is not in acute distress.    Appearance: Normal appearance. She is not ill-appearing, toxic-appearing or diaphoretic.  HENT:     Head: Normocephalic and atraumatic.     Right Ear: External ear normal.     Left Ear: External ear normal.  Eyes:     General: No  scleral icterus.       Right eye: No discharge.        Left eye: No discharge.     Extraocular Movements: Extraocular movements intact.     Conjunctiva/sclera: Conjunctivae normal.  Pulmonary:     Effort: Pulmonary effort is normal.  Skin:    General: Skin is warm and dry.  Neurological:     Mental Status: She is alert and oriented to person, place, and time.  Psychiatric:        Mood and Affect: Mood normal.        Behavior: Behavior normal.      No results found for any visits on 04/29/22.    The 10-year ASCVD risk score (Arnett DK, et al., 2019) is: 6.4%    Assessment & Plan:   Problem List Items Addressed This Visit       Cardiovascular and Mediastinum   Essential hypertension - Primary   Relevant Orders   Basic metabolic panel     Other   Elevated cholesterol   Relevant Orders   LDL cholesterol, direct   Vitamin B 12 deficiency   Relevant Orders   Vitamin B12   Need for shingles vaccine   Relevant Orders   Varicella-zoster vaccine IM    Return in about 6 months (around 10/29/2022), or May switch to  a multivitamin with B12.  Hold atorvastatin..  Will use Voltaren gel as needed for arm pain after Shingrix vaccine.  May switch to multivitamin with B12.  Hold atorvastatin.   Mliss Sax, MD

## 2022-05-05 ENCOUNTER — Other Ambulatory Visit: Payer: Self-pay | Admitting: Family Medicine

## 2022-08-06 ENCOUNTER — Other Ambulatory Visit: Payer: Self-pay | Admitting: Family Medicine

## 2022-08-06 DIAGNOSIS — I10 Essential (primary) hypertension: Secondary | ICD-10-CM

## 2022-10-28 ENCOUNTER — Encounter: Payer: Self-pay | Admitting: Family Medicine

## 2022-10-28 ENCOUNTER — Ambulatory Visit (INDEPENDENT_AMBULATORY_CARE_PROVIDER_SITE_OTHER): Payer: 59 | Admitting: Family Medicine

## 2022-10-28 VITALS — BP 138/82 | HR 81 | Temp 97.3°F | Ht 61.0 in | Wt 169.6 lb

## 2022-10-28 DIAGNOSIS — I10 Essential (primary) hypertension: Secondary | ICD-10-CM | POA: Diagnosis not present

## 2022-10-28 DIAGNOSIS — E876 Hypokalemia: Secondary | ICD-10-CM | POA: Diagnosis not present

## 2022-10-28 DIAGNOSIS — E7800 Pure hypercholesterolemia, unspecified: Secondary | ICD-10-CM | POA: Diagnosis not present

## 2022-10-28 LAB — BASIC METABOLIC PANEL
BUN: 13 mg/dL (ref 6–23)
CO2: 26 mEq/L (ref 19–32)
Calcium: 9.3 mg/dL (ref 8.4–10.5)
Chloride: 107 mEq/L (ref 96–112)
Creatinine, Ser: 0.8 mg/dL (ref 0.40–1.20)
GFR: 79.71 mL/min (ref >60.00)
Glucose, Bld: 94 mg/dL (ref 70–99)
Potassium: 3.8 mEq/L (ref 3.5–5.1)
Sodium: 143 mEq/L (ref 135–145)

## 2022-10-28 LAB — LIPID PANEL
Cholesterol: 228 mg/dL — ABNORMAL HIGH (ref 0–200)
HDL: 55.3 mg/dL (ref >39.00)
LDL Cholesterol: 152 mg/dL — ABNORMAL HIGH (ref 0–99)
NonHDL: 173.08
Total CHOL/HDL Ratio: 4
Triglycerides: 106 mg/dL (ref 0.0–149.0)
VLDL: 21.2 mg/dL (ref 0.0–40.0)

## 2022-10-28 MED ORDER — AMLODIPINE BESYLATE 5 MG PO TABS: 5.0000 mg | ORAL_TABLET | Freq: Every day | ORAL | 3 refills | Status: DC

## 2022-10-28 NOTE — Progress Notes (Signed)
Established Patient Office Visit   Subjective:  Patient ID: Amanda Raymond, female    DOB: 1960/12/01  Age: 61 y.o. MRN: [id number]  Chief Complaint  Patient presents with   Follow-up    6 month follow no concerns. Patient have decreased to 5mg  of Amlodipine x 3 weeks.     HPI Encounter Diagnoses  Name Primary?   Essential hypertension Yes   Hypokalemia    Elevated cholesterol    Developed lower extremity edema with 10 mg of amlodipine.  Blood pressure has been fairly well-controlled on the 5 mg dose.  She has been able to lose some more weight.   Review of Systems  Constitutional: Negative.   HENT: Negative.    Eyes:  Negative for blurred vision, discharge and redness.  Respiratory: Negative.    Cardiovascular: Negative.   Gastrointestinal:  Negative for abdominal pain.  Genitourinary: Negative.   Musculoskeletal: Negative.  Negative for myalgias.  Skin:  Negative for rash.  Neurological:  Negative for tingling, loss of consciousness and weakness.  Endo/Heme/Allergies:  Negative for polydipsia.     Current Outpatient Medications:    amLODipine (NORVASC) 5 MG tablet, Take 1 tablet (5 mg total) by mouth daily., Disp: 90 tablet, Rfl: 3   Multiple Vitamin (MULTIVITAMIN) tablet, Take 1 tablet by mouth daily., Disp: , Rfl:    Objective:     BP 138/82 (BP Location: Right Arm, Patient Position: Sitting, Cuff Size: Normal)   Pulse 81   Temp (!) 97.3 F (36.3 C) (Temporal)   Ht 5\' 1"  (1.549 m)   Wt 169 lb 9.6 oz (76.9 kg)   SpO2 99%   BMI 32.05 kg/m  BP Readings from Last 3 Encounters:  10/28/22 138/82  04/29/22 134/82  01/29/22 (!) 150/90   Wt Readings from Last 3 Encounters:  10/28/22 169 lb 9.6 oz (76.9 kg)  04/29/22 160 lb (72.6 kg)  01/29/22 162 lb 6.4 oz (73.7 kg)      Physical Exam Constitutional:      General: She is not in acute distress.    Appearance: Normal appearance. She is not ill-appearing, toxic-appearing or diaphoretic.  HENT:     Head:  Normocephalic and atraumatic.     Right Ear: External ear normal.     Left Ear: External ear normal.     Mouth/Throat:     Mouth: Mucous membranes are moist.     Pharynx: Oropharynx is clear. No oropharyngeal exudate or posterior oropharyngeal erythema.  Eyes:     General: No scleral icterus.       Right eye: No discharge.        Left eye: No discharge.     Extraocular Movements: Extraocular movements intact.     Conjunctiva/sclera: Conjunctivae normal.     Pupils: Pupils are equal, round, and reactive to light.  Cardiovascular:     Rate and Rhythm: Normal rate and regular rhythm.  Pulmonary:     Effort: Pulmonary effort is normal. No respiratory distress.     Breath sounds: Normal breath sounds.  Abdominal:     General: Bowel sounds are normal.     Tenderness: There is no abdominal tenderness. There is no guarding.  Musculoskeletal:     Cervical back: No rigidity or tenderness.     Right lower leg: No edema.     Left lower leg: No edema.  Skin:    General: Skin is warm and dry.  Neurological:     Mental Status: She is alert and oriented  to person, place, and time.  Psychiatric:        Mood and Affect: Mood normal.        Behavior: Behavior normal.      No results found for any visits on 10/28/22.    The 10-year ASCVD risk score (Arnett DK, et al., 2019) is: 7.3%    Assessment & Plan:   Essential hypertension -     Basic metabolic panel -     amLODIPine Besylate; Take 1 tablet (5 mg total) by mouth daily.  Dispense: 90 tablet; Refill: 3  Hypokalemia -     Basic metabolic panel  Elevated cholesterol -     Lipid panel    Return in about 6 months (around 04/29/2023), or if symptoms worsen or fail to improve.  Continue amlodipine 5 mg.  Information was given on managing hypertension.  Rechecking potassium today.  Encouraged exercise and ongoing weight loss efforts.  Mliss Sax, MD

## 2023-01-02 ENCOUNTER — Encounter: Payer: Self-pay | Admitting: Nurse Practitioner

## 2023-01-02 ENCOUNTER — Other Ambulatory Visit (HOSPITAL_BASED_OUTPATIENT_CLINIC_OR_DEPARTMENT_OTHER): Payer: Self-pay | Admitting: Nurse Practitioner

## 2023-01-02 ENCOUNTER — Ambulatory Visit (INDEPENDENT_AMBULATORY_CARE_PROVIDER_SITE_OTHER): Payer: 59 | Admitting: Nurse Practitioner

## 2023-01-02 VITALS — BP 122/80 | Ht 61.5 in | Wt 171.0 lb

## 2023-01-02 DIAGNOSIS — Z780 Asymptomatic menopausal state: Secondary | ICD-10-CM

## 2023-01-02 DIAGNOSIS — Z01419 Encounter for gynecological examination (general) (routine) without abnormal findings: Secondary | ICD-10-CM | POA: Diagnosis not present

## 2023-01-02 DIAGNOSIS — Z1231 Encounter for screening mammogram for malignant neoplasm of breast: Secondary | ICD-10-CM

## 2023-01-02 NOTE — Progress Notes (Signed)
   Amanda Raymond November 30, 1960 [id number]   History:  62 y.o. G3P3 presents for annual exam. No GYN complaints. Postmenopausal - no HRT, no bleeding. Normal pap and mammogram history. HTN, HLD managed by PCP.  Gynecologic History No LMP recorded. Patient has had an ablation.   Contraception/Family planning: post menopausal status Sexually active: No  Health Maintenance Last Pap: 07/17/2021. Results were: Normal neg HPV, 5-year repeat Last mammogram: 06/11/2021. Results were: Normal Last colonoscopy: 2018. Results were: Normal, 10-year recall Last Dexa: 04/17/2020. Results were: Normal  Past medical history, past surgical history, family history and social history were all reviewed and documented in the EPIC chart. Single. One daughter graduated from [employer] May 2023. Other daughter graduated December 2023 with degree in HR. Toy poodle named Mocha. From [place].   ROS:  A ROS was performed and pertinent positives and negatives are included.  Exam:  Vitals:   01/02/23 0854  BP: 122/80  Weight: 171 lb (77.6 kg)  Height: 5' 1.5" (1.562 m)    Body mass index is 31.79 kg/m.  General appearance:  Normal Thyroid:  Symmetrical, normal in size, without palpable masses or nodularity. Respiratory  Auscultation:  Clear without wheezing or rhonchi Cardiovascular  Auscultation:  Regular rate, without rubs, murmurs or gallops  Edema/varicosities:  Not grossly evident Abdominal  Soft,nontender, without masses, guarding or rebound.  Liver/spleen:  No organomegaly noted  Hernia:  None appreciated  Skin  Inspection:  Grossly normal Breasts: Examined lying and sitting.   Right: Without masses, retractions, nipple discharge or axillary adenopathy.   Left: Without masses, retractions, nipple discharge or axillary adenopathy. Genitourinary   Inguinal/mons:  Normal without inguinal adenopathy  External genitalia:  Normal appearing vulva with no masses, tenderness, or lesions  BUS/Urethra/Skene's  glands:  Normal  Vagina:  Atrophic changes  Cervix:  Normal appearing without discharge or lesions  Uterus:  Normal in size, shape and contour.  Midline and mobile, nontender  Adnexa/parametria:     Rt: Normal in size, without masses or tenderness.   Lt: Normal in size, without masses or tenderness.  Anus and perineum: Normal  Digital rectal exam: Deferred  Patient informed chaperone available to be present for breast and pelvic exam. Patient has requested no chaperone to be present. Patient has been advised what will be completed during breast and pelvic exam.   Assessment/Plan:  62 y.o. G3P3 for annual exam.   Well female exam with routine gynecological exam - Education provided on SBEs, importance of preventative screenings, current guidelines, high calcium diet, regular exercise, and multivitamin daily. Labs with PCP.   Postmenopausal - no HRT, no bleeding.   Screening for cervical cancer - Normal Pap history. Will repeat at 5-year interval per guidelines.   Screening for breast cancer - Normal mammogram history. Overdue and plans to schedule soon. Normal breast exam today.  Screening for colon cancer - 2018 colonoscopy (per patient). Will repeat at GI's recommended interval.   Screening for osteoporosis - Normal Dexa 06/2021. Will repeat at 5-year interval per recommendation.   Return in 1 year for annual.     Tamela Gammon DNP, 9:16 AM 01/02/2023

## 2023-01-21 ENCOUNTER — Encounter (HOSPITAL_BASED_OUTPATIENT_CLINIC_OR_DEPARTMENT_OTHER): Payer: Self-pay

## 2023-01-21 ENCOUNTER — Ambulatory Visit (HOSPITAL_BASED_OUTPATIENT_CLINIC_OR_DEPARTMENT_OTHER)
Admission: RE | Admit: 2023-01-21 | Discharge: 2023-01-21 | Disposition: A | Discharge status: Home / Self Care | Payer: 59 | Source: Ambulatory Visit | Attending: Nurse Practitioner | Admitting: Nurse Practitioner

## 2023-01-21 DIAGNOSIS — Z1231 Encounter for screening mammogram for malignant neoplasm of breast: Secondary | ICD-10-CM

## 2023-01-28 ENCOUNTER — Other Ambulatory Visit: Payer: Self-pay | Admitting: Family Medicine

## 2023-01-28 DIAGNOSIS — I10 Essential (primary) hypertension: Secondary | ICD-10-CM

## 2023-03-11 IMAGING — MG MM DIGITAL SCREENING BILAT W/ TOMO AND CAD
8 series · 8 of 24 positions shown · non-contrast
Comparison: Previous exam(s).

ACR Breast Density Category a: The breast tissue is almost entirely
fatty.

CLINICAL DATA: Screening.

EXAM:
DIGITAL SCREENING BILATERAL MAMMOGRAM WITH TOMOSYNTHESIS AND CAD
TECHNIQUE: Bilateral screening digital craniocaudal and mediolateral oblique
mammograms were obtained. Bilateral screening digital breast
tomosynthesis was performed. The images were evaluated with
computer-aided detection.

[R MLO synth-2D]
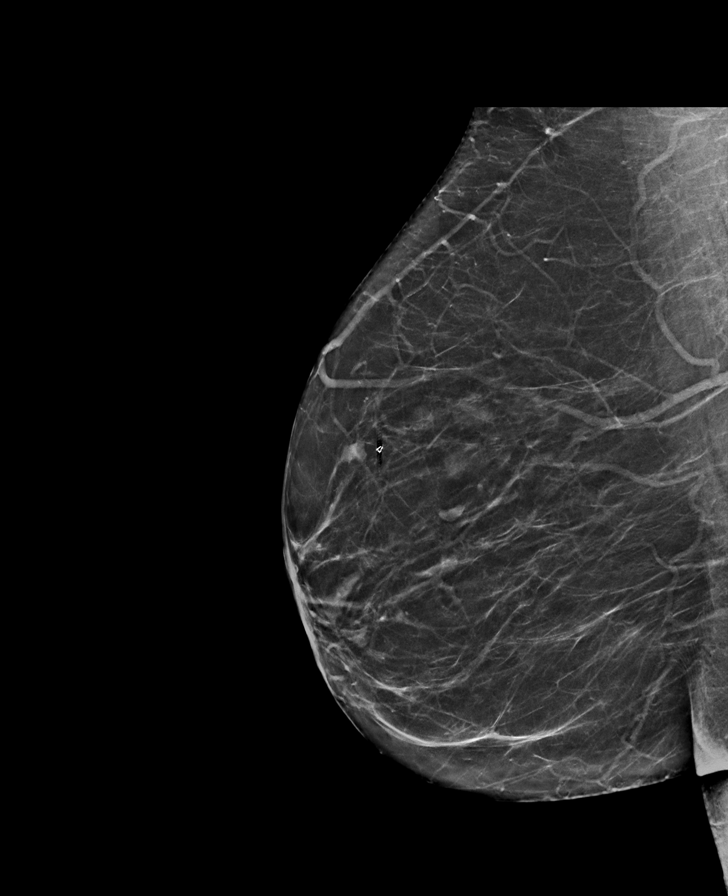

[R CC synth-2D]
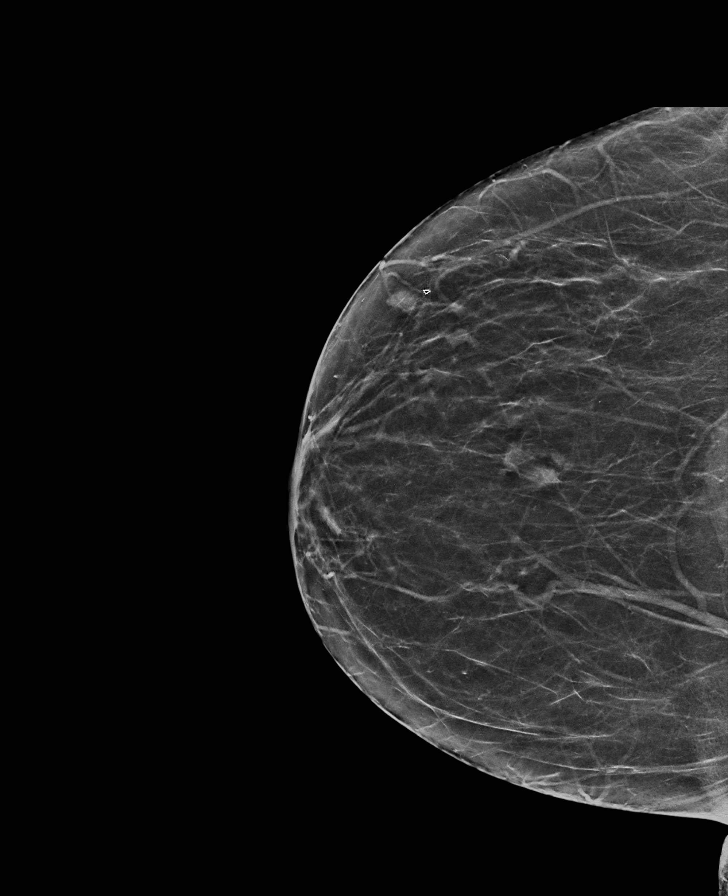

[L CC synth-2D]
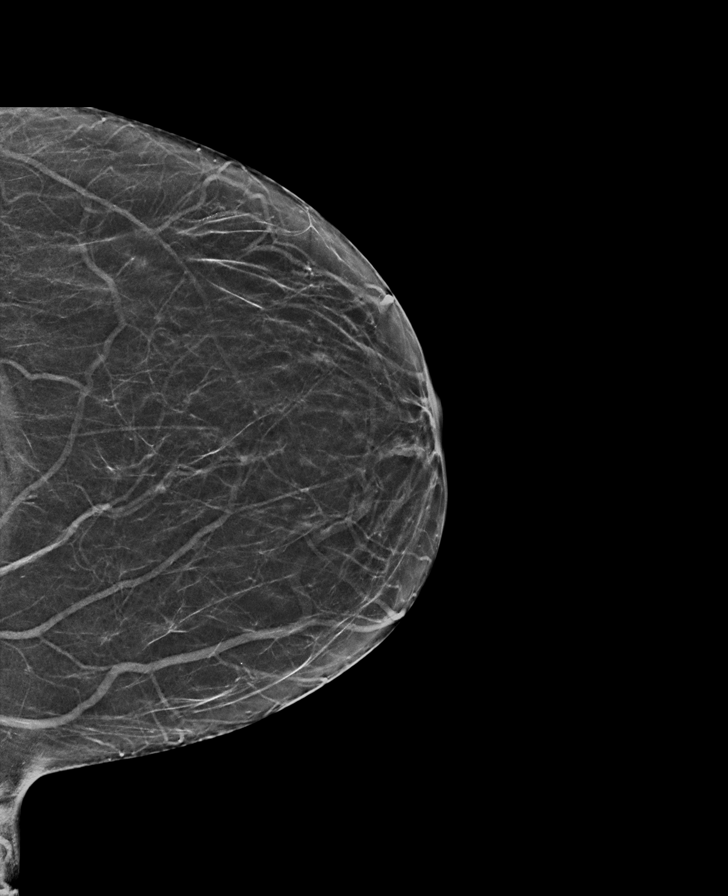

[L MLO synth-2D]
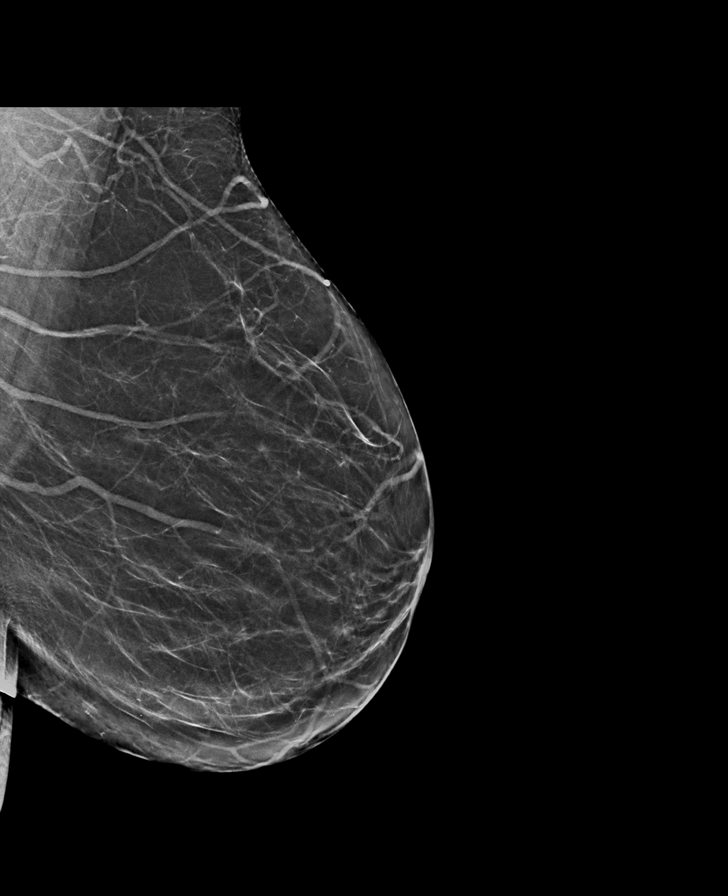

[R MLO tomo · tomo slice 33/64.0]
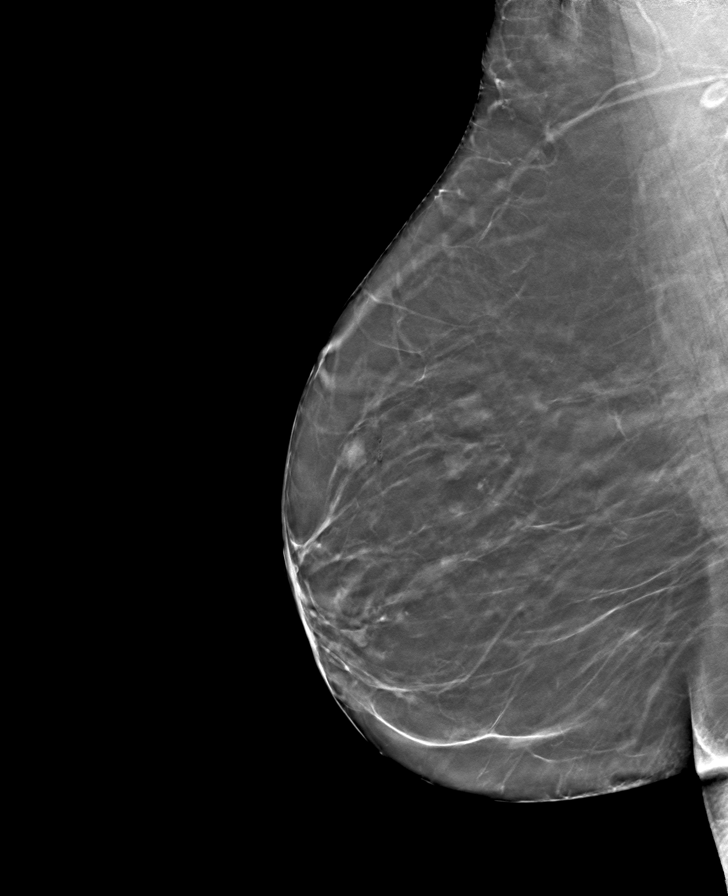

[L CC tomo · tomo slice 27/54.0]
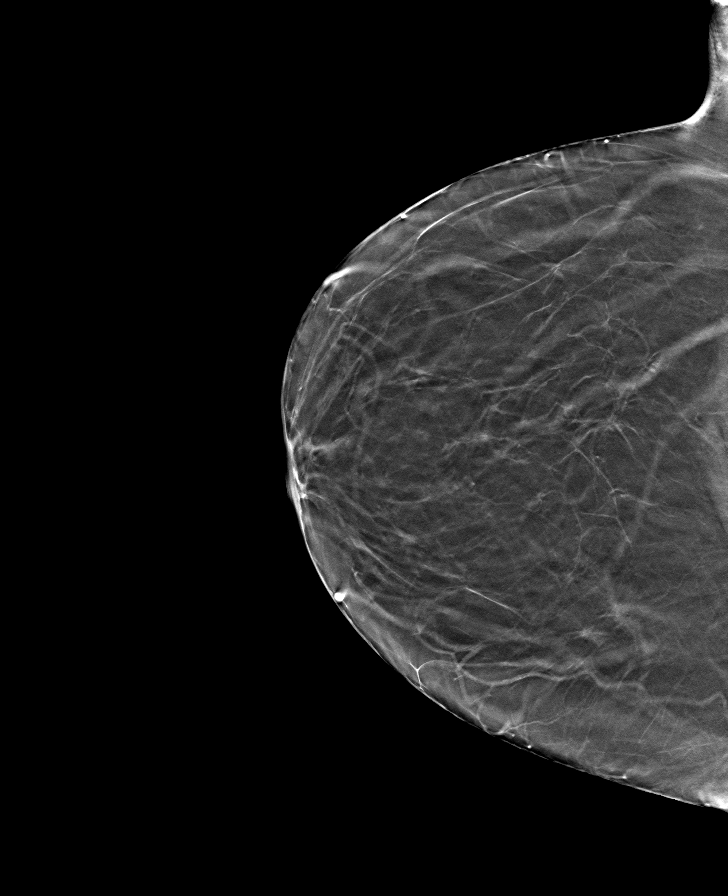

[R CC tomo · tomo slice 31/60.0]
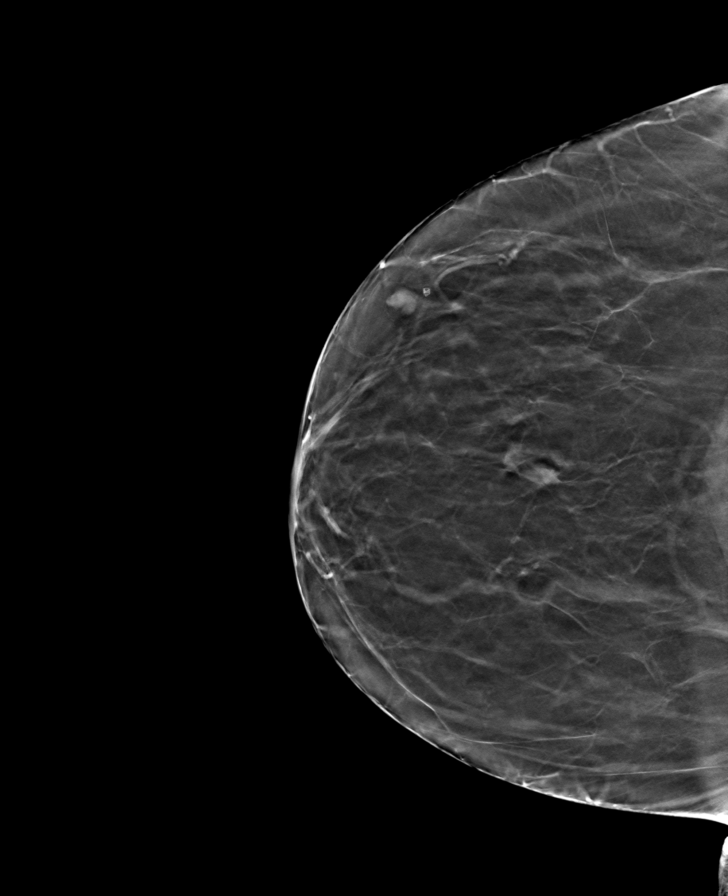

[L MLO tomo · tomo slice 30/59.0]
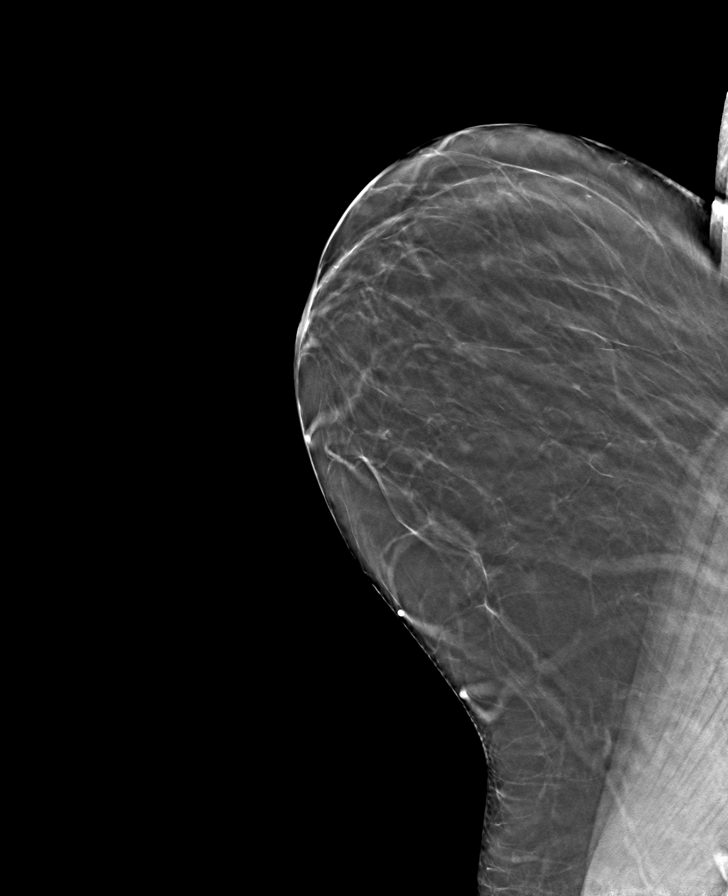

[8 of 24 positions shown; findings below may reference images not displayed]

FINDINGS: There are no findings suspicious for malignancy.
IMPRESSION: No mammographic evidence of malignancy. A result letter of this
screening mammogram will be mailed directly to the patient.

RECOMMENDATION:
Screening mammogram in one year. (Code:0E-3-N98)

BI-RADS CATEGORY  1: Negative.

## 2023-09-01 ENCOUNTER — Ambulatory Visit: Payer: 59 | Admitting: Family Medicine

## 2023-09-01 ENCOUNTER — Encounter: Payer: Self-pay | Admitting: Family Medicine

## 2023-09-01 VITALS — BP 124/82 | HR 80 | Temp 98.0°F | Ht 61.0 in | Wt 168.8 lb

## 2023-09-01 DIAGNOSIS — Z114 Encounter for screening for human immunodeficiency virus [HIV]: Secondary | ICD-10-CM

## 2023-09-01 DIAGNOSIS — I8393 Asymptomatic varicose veins of bilateral lower extremities: Secondary | ICD-10-CM

## 2023-09-01 DIAGNOSIS — E7800 Pure hypercholesterolemia, unspecified: Secondary | ICD-10-CM

## 2023-09-01 DIAGNOSIS — H608X3 Other otitis externa, bilateral: Secondary | ICD-10-CM

## 2023-09-01 DIAGNOSIS — Z1159 Encounter for screening for other viral diseases: Secondary | ICD-10-CM

## 2023-09-01 DIAGNOSIS — I10 Essential (primary) hypertension: Secondary | ICD-10-CM

## 2023-09-01 LAB — BASIC METABOLIC PANEL
BUN: 16 mg/dL (ref 6–23)
CO2: 28 meq/L (ref 19–32)
Calcium: 9.3 mg/dL (ref 8.4–10.5)
Chloride: 106 meq/L (ref 96–112)
Creatinine, Ser: 0.74 mg/dL (ref 0.40–1.20)
GFR: 87.01 mL/min (ref >60.00)
Glucose, Bld: 95 mg/dL (ref 70–99)
Potassium: 4 meq/L (ref 3.5–5.1)
Sodium: 144 meq/L (ref 135–145)

## 2023-09-01 LAB — LDL CHOLESTEROL, DIRECT: Direct LDL: 147 mg/dL

## 2023-09-01 MED ORDER — FLUOCINOLONE ACETONIDE 0.01 % OT OIL: TOPICAL_OIL | OTIC | 0 refills | Status: DC

## 2023-09-01 MED ORDER — ATORVASTATIN CALCIUM 20 MG PO TABS
20.0000 mg | ORAL_TABLET | Freq: Every day | ORAL | 3 refills | Status: AC
Start: 2023-09-01 — End: ?

## 2023-09-01 MED ORDER — HYDROCHLOROTHIAZIDE 25 MG PO TABS: 12.5000 mg | ORAL_TABLET | Freq: Every day | ORAL | 1 refills | Status: DC

## 2023-09-01 NOTE — Progress Notes (Signed)
Established Patient Office Visit   Subjective:  Patient ID: Amanda Raymond, female    DOB: [birth year]  Age: 62 y.o. MRN: [id number]  Chief Complaint  Patient presents with   Medical Management of Chronic Issues    Follow up. Over due follow up. Pt states she stopped taking amlodipine because it caused back pain and lightheadedness.     HPI Encounter Diagnoses  Name Primary?   Elevated LDL cholesterol level Yes   Essential hypertension    Noninfectious otitis externa of both ears, unspecified chronicity, unspecified type    Varicose veins of both lower extremities, unspecified whether complicated    Screening for HIV (human immunodeficiency virus)    Encounter for hepatitis C screening test for low risk patient    Developed left lower back pain and decided to discontinue amlodipine.  It seemed to resolve.  She restarted the amlodipine a few weeks later and the back pain came back.  Muscle cramps are listed as a rare side effect of amlodipine.  After holiday from atorvastatin LDL did increase again.  Will need to restart it today.  Complains of itchy ears and tinnitus.   Review of Systems  Constitutional: Negative.   HENT:  Positive for tinnitus. Negative for ear discharge, ear pain and hearing loss.   Eyes:  Negative for blurred vision, discharge and redness.  Respiratory: Negative.    Cardiovascular: Negative.   Gastrointestinal:  Negative for abdominal pain.  Genitourinary: Negative.   Musculoskeletal: Negative.  Negative for myalgias.  Skin:  Negative for rash.  Neurological:  Negative for tingling, loss of consciousness and weakness.  Endo/Heme/Allergies:  Negative for polydipsia.     Current Outpatient Medications:    atorvastatin (LIPITOR) 20 MG tablet, Take 1 tablet (20 mg total) by mouth daily., Disp: 90 tablet, Rfl: 3   Fluocinolone Acetonide 0.01 % OIL, Please 4 drops in each ear twice daily for 7 days and then as needed., Disp: 20 mL, Rfl: 0    hydrochlorothiazide (HYDRODIURIL) 25 MG tablet, Take 0.5 tablets (12.5 mg total) by mouth daily., Disp: 45 tablet, Rfl: 1   Multiple Vitamin (MULTIVITAMIN) tablet, Take 1 tablet by mouth daily., Disp: , Rfl:    amLODipine (NORVASC) 5 MG tablet, TAKE 1 TABLET BY MOUTH EVERY DAY. DISCONTINUE IRBESARTAN 300MG  (Patient not taking: Reported on 09/01/2023), Disp: 90 tablet, Rfl: 1   Objective:     BP 124/82   Pulse 80   Temp 98 F (36.7 C)   Ht 5\' 1"  (1.549 m)   Wt 168 lb 12.8 oz (76.6 kg)   SpO2 99%   BMI 31.89 kg/m  BP Readings from Last 3 Encounters:  09/01/23 124/82  01/02/23 122/80  10/28/22 138/82   Wt Readings from Last 3 Encounters:  09/01/23 168 lb 12.8 oz (76.6 kg)  01/02/23 171 lb (77.6 kg)  10/28/22 169 lb 9.6 oz (76.9 kg)      Physical Exam Constitutional:      General: She is not in acute distress.    Appearance: Normal appearance. She is not ill-appearing, toxic-appearing or diaphoretic.  HENT:     Head: Normocephalic and atraumatic.     Right Ear: Tympanic membrane and external ear normal.     Left Ear: Tympanic membrane and external ear normal.     Ears:     Comments: Canals with mild erythema.  There is no rash or discharge.    Mouth/Throat:     Mouth: Mucous membranes are moist.  Pharynx: Oropharynx is clear. No oropharyngeal exudate or posterior oropharyngeal erythema.  Eyes:     General: No scleral icterus.       Right eye: No discharge.        Left eye: No discharge.     Extraocular Movements: Extraocular movements intact.     Conjunctiva/sclera: Conjunctivae normal.     Pupils: Pupils are equal, round, and reactive to light.  Cardiovascular:     Rate and Rhythm: Normal rate and regular rhythm.  Pulmonary:     Effort: Pulmonary effort is normal. No respiratory distress.     Breath sounds: Normal breath sounds.  Abdominal:     General: Bowel sounds are normal.     Tenderness: There is no abdominal tenderness. There is no guarding.   Musculoskeletal:     Cervical back: No rigidity or tenderness.  Skin:    General: Skin is warm and dry.  Neurological:     Mental Status: She is alert and oriented to person, place, and time.  Psychiatric:        Mood and Affect: Mood normal.        Behavior: Behavior normal.      No results found for any visits on 09/01/23.     The 10-year ASCVD risk score (Arnett DK, et al., 2019) is: 16.9%    Assessment & Plan:   Elevated LDL cholesterol level -     Atorvastatin Calcium; Take 1 tablet (20 mg total) by mouth daily.  Dispense: 90 tablet; Refill: 3 -     LDL cholesterol, direct  Essential hypertension -     hydroCHLOROthiazide; Take 0.5 tablets (12.5 mg total) by mouth daily.  Dispense: 45 tablet; Refill: 1 -     Basic metabolic panel  Noninfectious otitis externa of both ears, unspecified chronicity, unspecified type -     Fluocinolone Acetonide; Please 4 drops in each ear twice daily for 7 days and then as needed.  Dispense: 20 mL; Refill: 0  Varicose veins of both lower extremities, unspecified whether complicated  Screening for HIV (human immunodeficiency virus) -     HIV Antibody (routine testing w rflx)  Encounter for hepatitis C screening test for low risk patient -     Hepatitis C antibody    Return in about 3 months (around 12/02/2023).  Have discontinued amlodipine secondary to a rare side effect (?).  Will start fluocinolone otic for itchy ears.  Restart atorvastatin for elevated ldl cholesterol.  Screening for HIV and Hep C.  Mliss Sax, MD

## 2023-09-02 ENCOUNTER — Other Ambulatory Visit: Payer: Self-pay | Admitting: Family Medicine

## 2023-09-02 DIAGNOSIS — H608X3 Other otitis externa, bilateral: Secondary | ICD-10-CM

## 2023-09-03 LAB — HEPATITIS C ANTIBODY: Hepatitis C Ab: NONREACTIVE

## 2023-09-03 LAB — HIV ANTIBODY (ROUTINE TESTING W REFLEX): HIV 1&2 Ab, 4th Generation: NONREACTIVE

## 2023-12-01 ENCOUNTER — Ambulatory Visit: Payer: 59 | Admitting: Family Medicine

## 2024-02-29 ENCOUNTER — Other Ambulatory Visit: Payer: Self-pay | Admitting: Family Medicine

## 2024-02-29 DIAGNOSIS — I10 Essential (primary) hypertension: Secondary | ICD-10-CM
